# Patient Record
Sex: Female | Born: 1955 | Race: Black or African American | Hispanic: No | Marital: Married | Smoking: Never smoker
Health system: Southern US, Community
[De-identification: ages and names within clinical notes are randomized; demographics above are authoritative.]

---

## 2017-11-13 ENCOUNTER — Other Ambulatory Visit: Payer: Self-pay

## 2017-11-13 ENCOUNTER — Encounter (HOSPITAL_COMMUNITY): Payer: Self-pay | Admitting: Emergency Medicine

## 2017-11-13 ENCOUNTER — Emergency Department (HOSPITAL_COMMUNITY)
Admission: EM | Admit: 2017-11-13 | Discharge: 2017-11-13 | Disposition: A | Discharge status: Home / Self Care | Payer: BC Managed Care – PPO | Attending: Emergency Medicine | Admitting: Emergency Medicine

## 2017-11-13 DIAGNOSIS — R739 Hyperglycemia, unspecified: Secondary | ICD-10-CM | POA: Diagnosis present

## 2017-11-13 LAB — BASIC METABOLIC PANEL
ANION GAP: 15 (ref 5–15)
BUN: 13 mg/dL (ref 6–20)
CALCIUM: 9.6 mg/dL (ref 8.9–10.3)
CO2: 22 mmol/L (ref 22–32)
CREATININE: 0.78 mg/dL (ref 0.44–1.00)
Chloride: 100 mmol/L — ABNORMAL LOW (ref 101–111)
GLUCOSE: 374 mg/dL — AB (ref 65–99)
Potassium: 4 mmol/L (ref 3.5–5.1)
Sodium: 137 mmol/L (ref 135–145)

## 2017-11-13 LAB — CBG MONITORING, ED
GLUCOSE-CAPILLARY: 379 mg/dL — AB (ref 65–99)
Glucose-Capillary: 241 mg/dL — ABNORMAL HIGH (ref 65–99)

## 2017-11-13 LAB — CBC
HCT: 38.5 % (ref 36.0–46.0)
HEMOGLOBIN: 12.3 g/dL (ref 12.0–15.0)
MCH: 25.5 pg — AB (ref 26.0–34.0)
MCHC: 31.9 g/dL (ref 30.0–36.0)
MCV: 79.7 fL (ref 78.0–100.0)
PLATELETS: 227 10*3/uL (ref 150–400)
RBC: 4.83 MIL/uL (ref 3.87–5.11)
RDW: 14.7 % (ref 11.5–15.5)
WBC: 6.7 10*3/uL (ref 4.0–10.5)

## 2017-11-13 LAB — URINALYSIS, ROUTINE W REFLEX MICROSCOPIC
BILIRUBIN URINE: NEGATIVE
Bacteria, UA: NONE SEEN
HGB URINE DIPSTICK: NEGATIVE
Ketones, ur: 80 mg/dL — AB
Leukocytes, UA: NEGATIVE
NITRITE: NEGATIVE
Protein, ur: NEGATIVE mg/dL
SPECIFIC GRAVITY, URINE: 1.031 — AB (ref 1.005–1.030)
pH: 5 (ref 5.0–8.0)

## 2017-11-13 MED ORDER — METFORMIN HCL 500 MG PO TABS: 500.0000 mg | ORAL_TABLET | Freq: Two times a day (BID) | ORAL | 1 refills | Status: AC

## 2017-11-13 MED ORDER — SODIUM CHLORIDE 0.9 % IV BOLUS (SEPSIS)
2000.0000 mL | Freq: Once | INTRAVENOUS | Status: AC
  Administered 2017-11-13: 2000 mL via INTRAVENOUS

## 2017-11-13 NOTE — ED Triage Notes (Addendum)
Pt was sent by [facility] for hyperglycemia.  Pt denies having hx of diabetes. Pt c/o of dizziness, dry mouth, frequency in urination.

## 2017-11-13 NOTE — Discharge Instructions (Signed)
It is important to drink 1 or 2 L of water each day to help decrease the glucose level.  This will also improve your hydration, and make you feel better.  Began taking the metformin prescription, today.  Stay on a low carbohydrate, and schedule an appointment to see your PCP for a checkup in 1 or 2 weeks.  We are giving you some additional information on treatment of diabetes, with diet, exercise, and self care.

## 2017-11-13 NOTE — ED Provider Notes (Signed)
[facility]ERGENCY DEPARTMENT Provider Note   CSN: [id number] Arrival date & time: 11/13/17  1143     History   Chief Complaint Chief Complaint  Patient presents with  . Hyperglycemia    HPI Sheri Small is a 62 y.o. female.  She presents for evaluation of hyperglycemia, first checked at home using her husband's meter, then checked today at a urgent care.  She was therefore directed here for further evaluation, after CBG was found to be greater than 400 and urine contained ketones.  The patient reports her only symptom is dry mouth, and urinary frequency.  She denies nausea, vomiting, abdominal pain, dysuria or hematuria.  She does not have prior episodes of hyperglycemia.  She is a retired Engineer, civil (consulting)nurse.  There is been no fever, chills, focal weakness or paresthesia.  She denies shortness of breath or chest pain.  There are no other known modifying factors.    HPI  History reviewed. No pertinent past medical history.  There are no active problems to display for this patient.   History reviewed. No pertinent surgical history.  OB History   None      Home Medications    Prior to Admission medications   Medication Sig Start Date End Date Taking? Authorizing Provider  metFORMIN (GLUCOPHAGE) 500 MG tablet Take 1 tablet (500 mg total) by mouth 2 (two) times daily with a meal. 11/13/17   Mancel BaleWentz, Eon Zunker, MD    Family History No family history on file.  Social History Social History   Tobacco Use  . Smoking status: Never Smoker  . Smokeless tobacco: Never Used  Substance Use Topics  . Alcohol use: No    Frequency: Never  . Drug use: No     Allergies   Patient has no allergy information on record.   Review of Systems Review of Systems  All other systems reviewed and are negative.    Physical Exam Updated Vital Signs BP (!) 122/58 (BP Location: Left Arm)   Pulse 60   Temp 97.9 F (36.6 C) (Oral)   Resp 16   Ht 5\' 5"  (1.651 m)   Wt 87.1 kg (192 lb)    SpO2 100%   BMI 31.95 kg/m   Physical Exam  Constitutional: She is oriented to person, place, and time. She appears well-developed and well-nourished. No distress.  HENT:  Head: Normocephalic and atraumatic.  Mouth/Throat: Oropharynx is clear and moist.  Eyes: Conjunctivae and EOM are normal. Pupils are equal, round, and reactive to light.  Neck: Normal range of motion and phonation normal. Neck supple.  Cardiovascular: Normal rate and regular rhythm.  Pulmonary/Chest: Effort normal and breath sounds normal. No respiratory distress. She exhibits no tenderness.  Abdominal: Soft. She exhibits no distension. There is no tenderness. There is no guarding.  Musculoskeletal: Normal range of motion.  Neurological: She is alert and oriented to person, place, and time. She exhibits normal muscle tone.  Skin: Skin is warm and dry.  Psychiatric: She has a normal mood and affect. Her behavior is normal. Judgment and thought content normal.  Nursing note and vitals reviewed.    ED Treatments / Results  Labs (all labs ordered are listed, but only abnormal results are displayed) Labs Reviewed  BASIC METABOLIC PANEL - Abnormal; Notable for the following components:      Result Value   Chloride 100 (*)    Glucose, Bld 374 (*)    All other components within normal limits  CBC - Abnormal; Notable for the  following components:   MCH 25.5 (*)    All other components within normal limits  URINALYSIS, ROUTINE W REFLEX MICROSCOPIC - Abnormal; Notable for the following components:   Specific Gravity, Urine 1.031 (*)    Glucose, UA >=500 (*)    Ketones, ur 80 (*)    Squamous Epithelial / LPF 0-5 (*)    All other components within normal limits  CBG MONITORING, ED - Abnormal; Notable for the following components:   Glucose-Capillary 379 (*)    All other components within normal limits  CBG MONITORING, ED - Abnormal; Notable for the following components:   Glucose-Capillary 241 (*)    All other  components within normal limits    EKG  EKG Interpretation None       Radiology No results found.  Procedures Procedures (including critical care time)  Medications Ordered in ED Medications  sodium chloride 0.9 % bolus 2,000 mL (0 mLs Intravenous Stopped 11/13/17 1435)     Initial Impression / Assessment and Plan / ED Course  I have reviewed the triage vital signs and the nursing notes.  Pertinent labs & imaging results that were available during my care of the patient were reviewed by me and considered in my medical decision making (see chart for details).  Clinical Course as of Nov 15 1145  Tue Nov 13, 2017  1447 Laboratory evaluation for hyperglycemia, indicates hyperglycemia with normal anion gap.  Chloride slightly low at 100.  CBC is normal, both hemoglobin and white count.  Urinalysis with elevated glucose, elevated specific gravity, and elevated ketones.   [EW]    Clinical Course User Index [EW] Mancel Bale, MD     No data found.  2:48 PM Reevaluation with update and discussion. After initial assessment and treatment, an updated evaluation reveals patient is more comfortable at this time.  No further complaints.  Findings discussed with patient and husband.  She requests that a prescription be forwarded to her pharmacy in [place]. Mancel Bale   Medical decision making-patient presenting for evaluation of hyperglycemia associated with polyuria and polydipsia.  Treatment with IV fluids improved CBG to 241.  Metabolic screening negative for elevated anion gap.  Urinalysis consistent with dehydration but no infection.  Patient hemodynamically stable in the emergency department.  No indication for hospitalization.  Plan outpatient treatment with initiation of oral antihypoglycemic agent.  Instructions given for weight loss, hydrate management, and follow-up with PCP.  Nursing Notes Reviewed/ Care Coordinated Applicable Imaging Reviewed Interpretation of  Laboratory Data incorporated into ED treatment  The patient appears reasonably screened and/or stabilized for discharge and I doubt any other medical condition or other [facility] requiring further screening, evaluation, or treatment in the ED at this time prior to discharge.  Plan: Home Medications-continue usual over-the-counter medicines and prescriptions; Home Treatments-rest, fluids low carbohydrate diet; return here if the recommended treatment, does not improve the symptoms; Recommended follow up-PCP follow-up 1-2 weeks and as needed.    Final Clinical Impressions(s) / ED Diagnoses   Final diagnoses:  Hyperglycemia   Symptomatic hyperglycemia, with normal anion gap.  Mild dehydration evidenced by elevated urine specific gravity.  Patient is nontoxic and feels better after treatment with IV fluids.  There is no indication of hospitalization at this time.  Doubt DKA, systemic infection, metabolic instability or impending vascular collapse.  Nursing Notes Reviewed/ Care Coordinated Applicable Imaging Reviewed Interpretation of Laboratory Data incorporated into ED treatment  The patient appears reasonably screened and/or stabilized for discharge and I doubt  any other medical condition or other [facility] requiring further screening, evaluation, or treatment in the ED at this time prior to discharge.  Plan: Home Medications-start metformin today, OTC, as needed; Home Treatments-rest, increase oral fluid intake especially water; return here if the recommended treatment, does not improve the symptoms; Recommended follow up-PCP follow-up 1-2 weeks.    ED Discharge Orders        Ordered    metFORMIN (GLUCOPHAGE) 500 MG tablet  2 times daily with meals     11/13/17 1449       Mancel Bale, MD 11/15/17 1149

## 2019-12-12 DIAGNOSIS — Z713 Dietary counseling and surveillance: Secondary | ICD-10-CM | POA: Diagnosis not present

## 2019-12-12 DIAGNOSIS — Z299 Encounter for prophylactic measures, unspecified: Secondary | ICD-10-CM | POA: Diagnosis not present

## 2019-12-12 DIAGNOSIS — E1165 Type 2 diabetes mellitus with hyperglycemia: Secondary | ICD-10-CM | POA: Diagnosis not present

## 2020-02-20 DIAGNOSIS — Z79899 Other long term (current) drug therapy: Secondary | ICD-10-CM | POA: Diagnosis not present

## 2020-02-20 DIAGNOSIS — Z0000 Encounter for general adult medical examination without abnormal findings: Secondary | ICD-10-CM | POA: Diagnosis not present

## 2020-02-20 DIAGNOSIS — E1165 Type 2 diabetes mellitus with hyperglycemia: Secondary | ICD-10-CM | POA: Diagnosis not present

## 2020-02-20 DIAGNOSIS — Z7189 Other specified counseling: Secondary | ICD-10-CM | POA: Diagnosis not present

## 2020-02-20 DIAGNOSIS — E7800 Pure hypercholesterolemia, unspecified: Secondary | ICD-10-CM | POA: Diagnosis not present

## 2020-02-20 DIAGNOSIS — Z1211 Encounter for screening for malignant neoplasm of colon: Secondary | ICD-10-CM | POA: Diagnosis not present

## 2020-02-20 DIAGNOSIS — Z299 Encounter for prophylactic measures, unspecified: Secondary | ICD-10-CM | POA: Diagnosis not present

## 2020-02-20 DIAGNOSIS — R5383 Other fatigue: Secondary | ICD-10-CM | POA: Diagnosis not present

## 2020-03-02 DIAGNOSIS — E1140 Type 2 diabetes mellitus with diabetic neuropathy, unspecified: Secondary | ICD-10-CM | POA: Diagnosis not present

## 2020-03-02 DIAGNOSIS — E1159 Type 2 diabetes mellitus with other circulatory complications: Secondary | ICD-10-CM | POA: Diagnosis not present

## 2020-03-08 DIAGNOSIS — R9431 Abnormal electrocardiogram [ECG] [EKG]: Secondary | ICD-10-CM | POA: Diagnosis not present

## 2020-03-19 DIAGNOSIS — Z299 Encounter for prophylactic measures, unspecified: Secondary | ICD-10-CM | POA: Diagnosis not present

## 2020-03-19 DIAGNOSIS — Z713 Dietary counseling and surveillance: Secondary | ICD-10-CM | POA: Diagnosis not present

## 2020-03-19 DIAGNOSIS — E7800 Pure hypercholesterolemia, unspecified: Secondary | ICD-10-CM | POA: Diagnosis not present

## 2020-03-19 DIAGNOSIS — E1165 Type 2 diabetes mellitus with hyperglycemia: Secondary | ICD-10-CM | POA: Diagnosis not present

## 2020-06-14 DIAGNOSIS — Z1231 Encounter for screening mammogram for malignant neoplasm of breast: Secondary | ICD-10-CM | POA: Diagnosis not present

## 2020-06-28 DIAGNOSIS — D509 Iron deficiency anemia, unspecified: Secondary | ICD-10-CM | POA: Diagnosis not present

## 2020-06-28 DIAGNOSIS — Z23 Encounter for immunization: Secondary | ICD-10-CM | POA: Diagnosis not present

## 2020-08-04 DIAGNOSIS — Z23 Encounter for immunization: Secondary | ICD-10-CM | POA: Diagnosis not present

## 2020-10-05 DIAGNOSIS — E7800 Pure hypercholesterolemia, unspecified: Secondary | ICD-10-CM | POA: Diagnosis not present

## 2020-10-05 DIAGNOSIS — Z299 Encounter for prophylactic measures, unspecified: Secondary | ICD-10-CM | POA: Diagnosis not present

## 2020-10-05 DIAGNOSIS — Z713 Dietary counseling and surveillance: Secondary | ICD-10-CM | POA: Diagnosis not present

## 2020-10-05 DIAGNOSIS — E1165 Type 2 diabetes mellitus with hyperglycemia: Secondary | ICD-10-CM | POA: Diagnosis not present

## 2021-02-25 DIAGNOSIS — Z7189 Other specified counseling: Secondary | ICD-10-CM | POA: Diagnosis not present

## 2021-02-25 DIAGNOSIS — E7800 Pure hypercholesterolemia, unspecified: Secondary | ICD-10-CM | POA: Diagnosis not present

## 2021-02-25 DIAGNOSIS — Z299 Encounter for prophylactic measures, unspecified: Secondary | ICD-10-CM | POA: Diagnosis not present

## 2021-02-25 DIAGNOSIS — Z87891 Personal history of nicotine dependence: Secondary | ICD-10-CM | POA: Diagnosis not present

## 2021-02-25 DIAGNOSIS — Z79899 Other long term (current) drug therapy: Secondary | ICD-10-CM | POA: Diagnosis not present

## 2021-02-25 DIAGNOSIS — R5383 Other fatigue: Secondary | ICD-10-CM | POA: Diagnosis not present

## 2021-02-25 DIAGNOSIS — Z0000 Encounter for general adult medical examination without abnormal findings: Secondary | ICD-10-CM | POA: Diagnosis not present

## 2021-02-25 DIAGNOSIS — E1165 Type 2 diabetes mellitus with hyperglycemia: Secondary | ICD-10-CM | POA: Diagnosis not present

## 2021-04-27 ENCOUNTER — Other Ambulatory Visit: Payer: Self-pay | Admitting: Internal Medicine

## 2021-04-27 DIAGNOSIS — Z139 Encounter for screening, unspecified: Secondary | ICD-10-CM

## 2021-04-29 ENCOUNTER — Ambulatory Visit
Admission: RE | Admit: 2021-04-29 | Discharge: 2021-04-29 | Disposition: A | Discharge status: Home / Self Care | Payer: Medicare Other | Source: Ambulatory Visit | Attending: Internal Medicine | Admitting: Internal Medicine

## 2021-04-29 ENCOUNTER — Other Ambulatory Visit: Payer: Self-pay

## 2021-04-29 DIAGNOSIS — Z1231 Encounter for screening mammogram for malignant neoplasm of breast: Secondary | ICD-10-CM | POA: Diagnosis not present

## 2021-04-29 DIAGNOSIS — Z139 Encounter for screening, unspecified: Secondary | ICD-10-CM

## 2021-06-09 DIAGNOSIS — Z789 Other specified health status: Secondary | ICD-10-CM | POA: Diagnosis not present

## 2021-06-09 DIAGNOSIS — E7800 Pure hypercholesterolemia, unspecified: Secondary | ICD-10-CM | POA: Diagnosis not present

## 2021-06-09 DIAGNOSIS — E1165 Type 2 diabetes mellitus with hyperglycemia: Secondary | ICD-10-CM | POA: Diagnosis not present

## 2021-06-09 DIAGNOSIS — Z23 Encounter for immunization: Secondary | ICD-10-CM | POA: Diagnosis not present

## 2021-06-09 DIAGNOSIS — Z299 Encounter for prophylactic measures, unspecified: Secondary | ICD-10-CM | POA: Diagnosis not present

## 2021-09-14 DIAGNOSIS — Z789 Other specified health status: Secondary | ICD-10-CM | POA: Diagnosis not present

## 2021-09-14 DIAGNOSIS — Z713 Dietary counseling and surveillance: Secondary | ICD-10-CM | POA: Diagnosis not present

## 2021-09-14 DIAGNOSIS — Z299 Encounter for prophylactic measures, unspecified: Secondary | ICD-10-CM | POA: Diagnosis not present

## 2021-09-14 DIAGNOSIS — E1165 Type 2 diabetes mellitus with hyperglycemia: Secondary | ICD-10-CM | POA: Diagnosis not present

## 2021-12-16 DIAGNOSIS — Z713 Dietary counseling and surveillance: Secondary | ICD-10-CM | POA: Diagnosis not present

## 2021-12-16 DIAGNOSIS — Z299 Encounter for prophylactic measures, unspecified: Secondary | ICD-10-CM | POA: Diagnosis not present

## 2021-12-16 DIAGNOSIS — E1165 Type 2 diabetes mellitus with hyperglycemia: Secondary | ICD-10-CM | POA: Diagnosis not present

## 2022-02-21 IMAGING — MG MM DIGITAL SCREENING BILAT W/ TOMO AND CAD
8 series · 9 of 24 positions shown · non-contrast
Comparison: Previous exam(s).

CLINICAL DATA: Screening.

EXAM:
DIGITAL SCREENING BILATERAL MAMMOGRAM WITH TOMOSYNTHESIS AND CAD
TECHNIQUE: Bilateral screening digital craniocaudal and mediolateral oblique
mammograms were obtained. Bilateral screening digital breast
tomosynthesis was performed. The images were evaluated with
computer-aided detection.

[R MLO synth-2D]
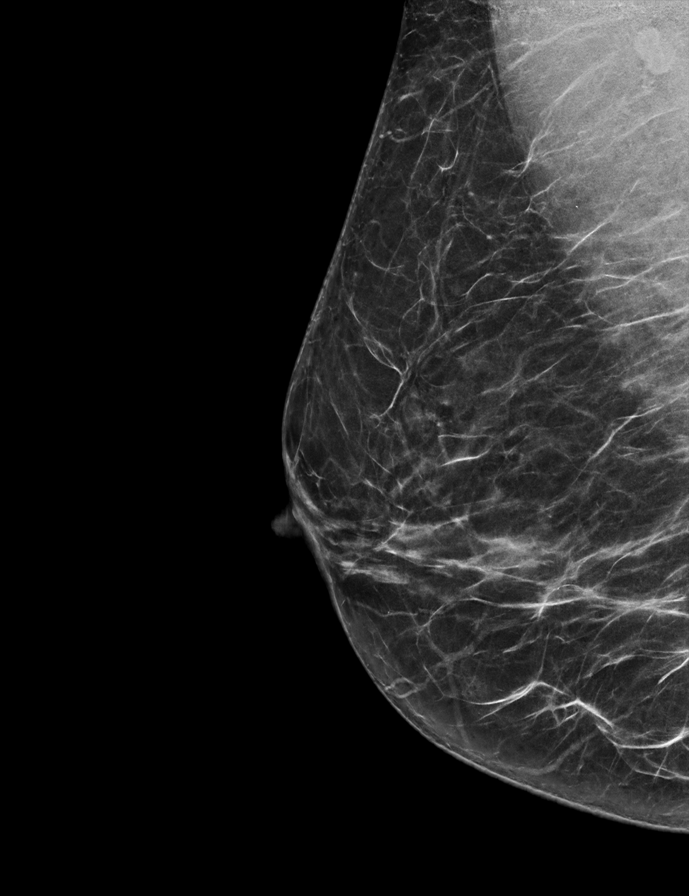

[R CC synth-2D]
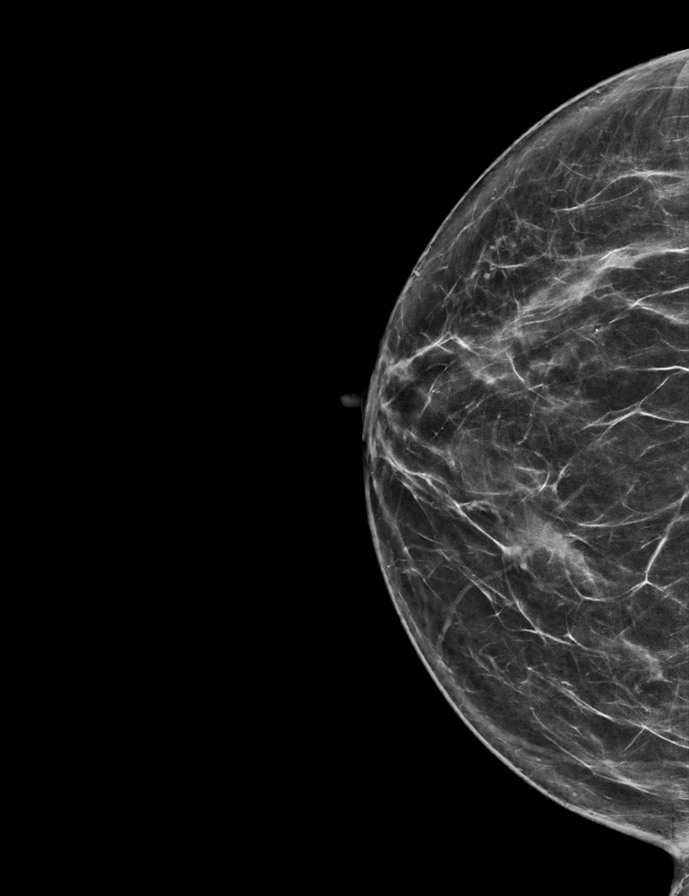

[L MLO synth-2D]
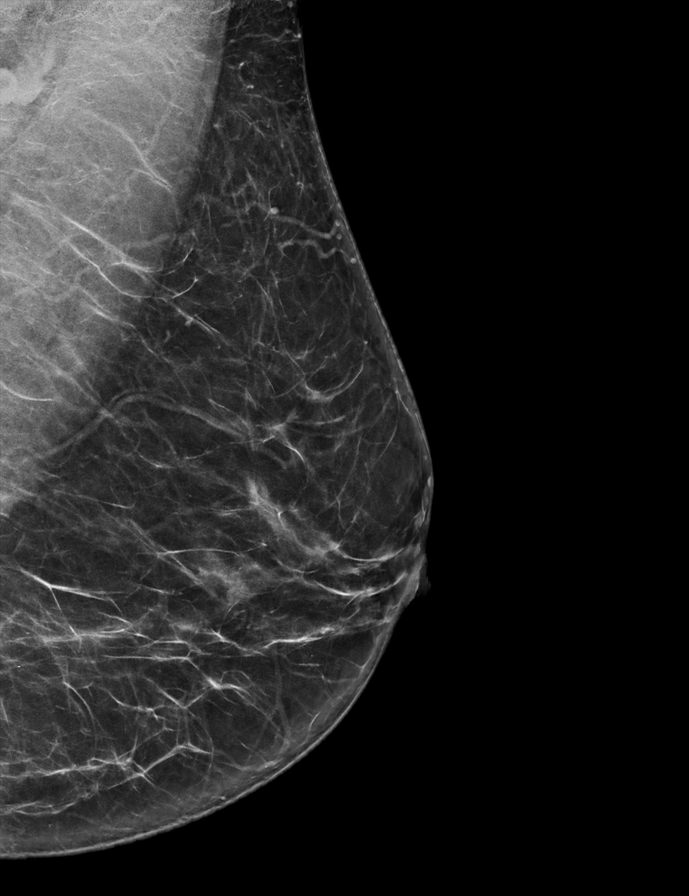

[L CC synth-2D]
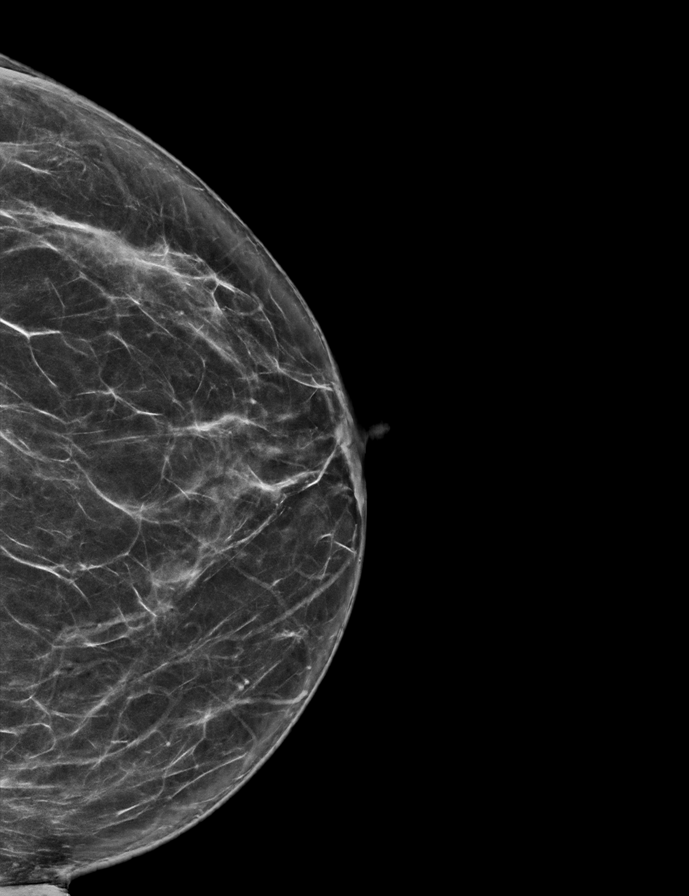

[L CC tomo · 2 of 69 frames shown]
[frame 23/69]
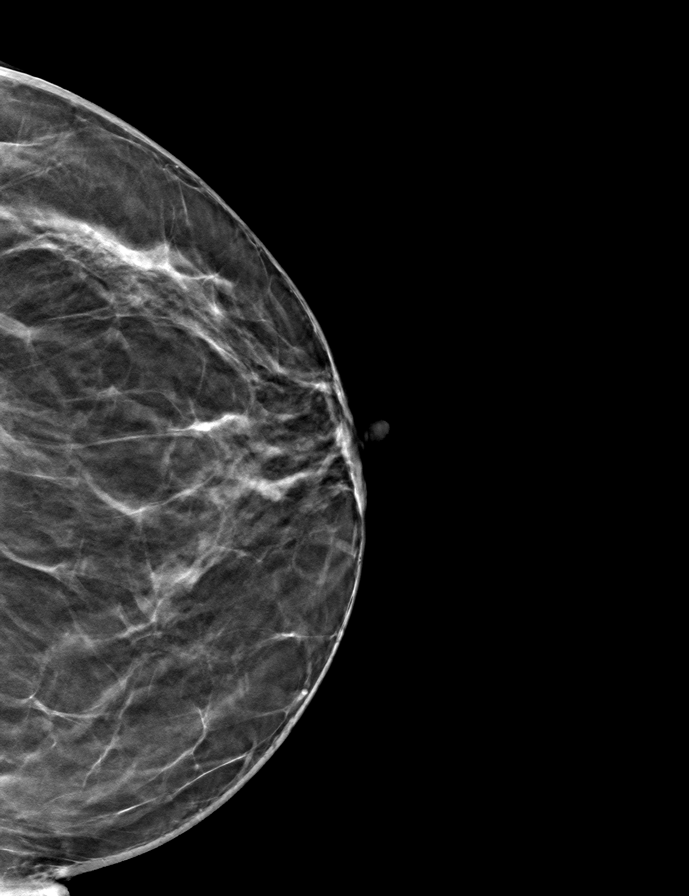
[frame 35/69]
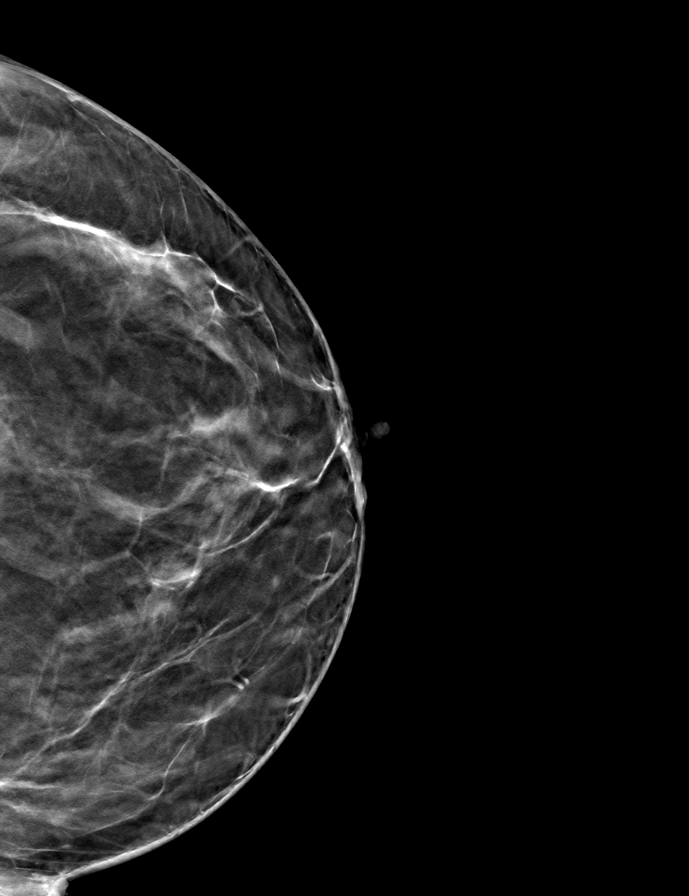

[R MLO tomo · tomo slice 33/65.0]
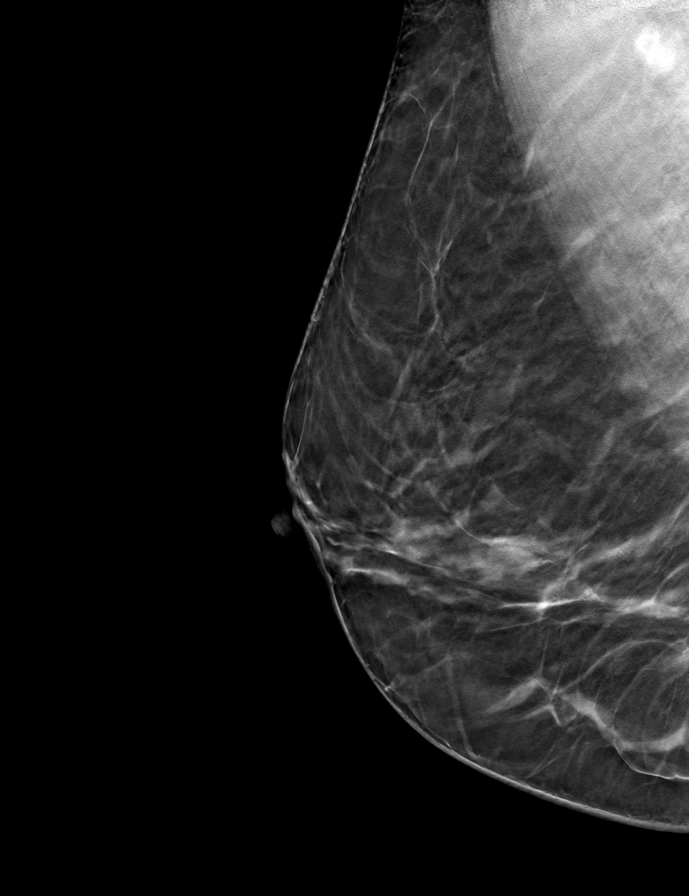

[L MLO tomo · tomo slice 37/72.0]
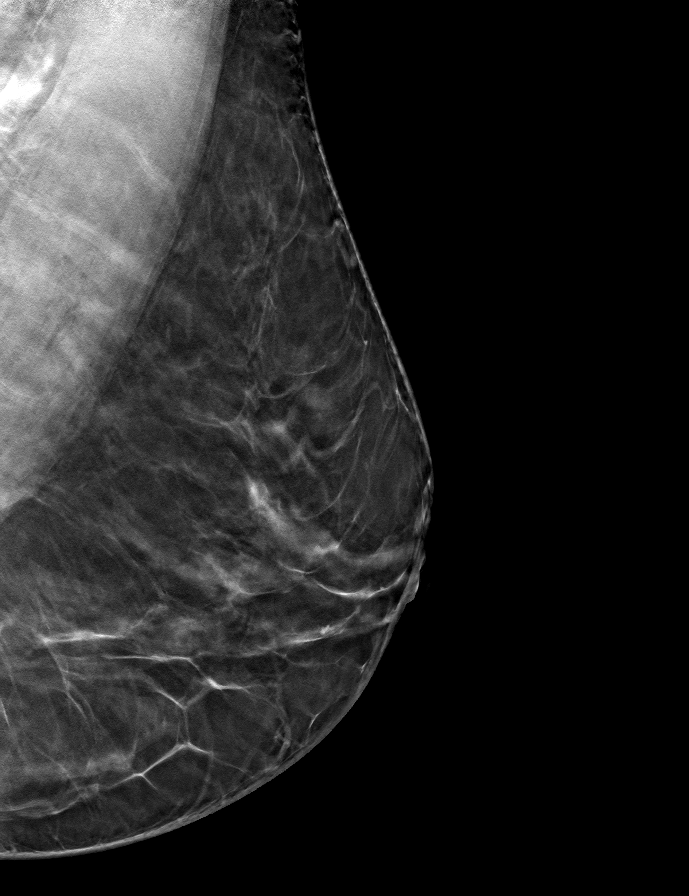

[R CC tomo · tomo slice 35/68.0]
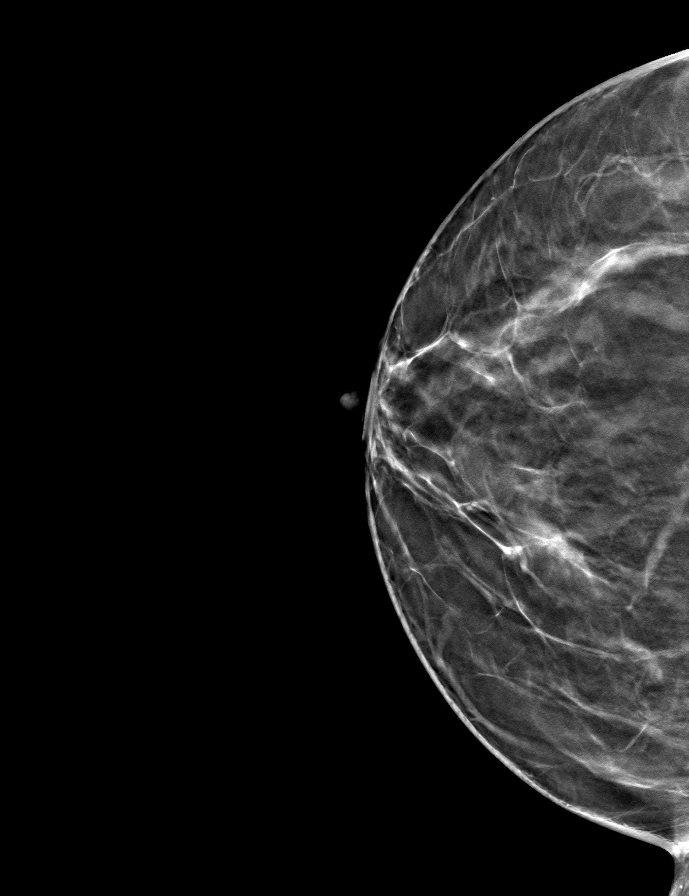

[9 of 24 positions shown; findings below may reference images not displayed]

ACR Breast Density Category b: There are scattered areas of
fibroglandular density.
FINDINGS: There are no findings suspicious for malignancy.
IMPRESSION: No mammographic evidence of malignancy. A result letter of this
screening mammogram will be mailed directly to the patient.

RECOMMENDATION:
Screening mammogram in one year. (Code:51-O-LD2)

BI-RADS CATEGORY  1: Negative.

## 2022-03-03 DIAGNOSIS — Z299 Encounter for prophylactic measures, unspecified: Secondary | ICD-10-CM | POA: Diagnosis not present

## 2022-03-03 DIAGNOSIS — R5383 Other fatigue: Secondary | ICD-10-CM | POA: Diagnosis not present

## 2022-03-03 DIAGNOSIS — Z789 Other specified health status: Secondary | ICD-10-CM | POA: Diagnosis not present

## 2022-03-03 DIAGNOSIS — Z79899 Other long term (current) drug therapy: Secondary | ICD-10-CM | POA: Diagnosis not present

## 2022-03-03 DIAGNOSIS — E7800 Pure hypercholesterolemia, unspecified: Secondary | ICD-10-CM | POA: Diagnosis not present

## 2022-03-03 DIAGNOSIS — Z7189 Other specified counseling: Secondary | ICD-10-CM | POA: Diagnosis not present

## 2022-03-03 DIAGNOSIS — Z0000 Encounter for general adult medical examination without abnormal findings: Secondary | ICD-10-CM | POA: Diagnosis not present

## 2022-03-24 DIAGNOSIS — Z713 Dietary counseling and surveillance: Secondary | ICD-10-CM | POA: Diagnosis not present

## 2022-03-24 DIAGNOSIS — D509 Iron deficiency anemia, unspecified: Secondary | ICD-10-CM | POA: Diagnosis not present

## 2022-03-24 DIAGNOSIS — E1165 Type 2 diabetes mellitus with hyperglycemia: Secondary | ICD-10-CM | POA: Diagnosis not present

## 2022-03-24 DIAGNOSIS — Z299 Encounter for prophylactic measures, unspecified: Secondary | ICD-10-CM | POA: Diagnosis not present

## 2022-05-10 ENCOUNTER — Other Ambulatory Visit: Payer: Self-pay | Admitting: Internal Medicine

## 2022-05-10 DIAGNOSIS — Z1231 Encounter for screening mammogram for malignant neoplasm of breast: Secondary | ICD-10-CM

## 2022-06-07 ENCOUNTER — Ambulatory Visit
Admission: RE | Admit: 2022-06-07 | Discharge: 2022-06-07 | Disposition: A | Discharge status: Home / Self Care | Payer: BC Managed Care – PPO | Source: Ambulatory Visit | Attending: Internal Medicine | Admitting: Internal Medicine

## 2022-06-07 DIAGNOSIS — Z1231 Encounter for screening mammogram for malignant neoplasm of breast: Secondary | ICD-10-CM

## 2022-06-26 DIAGNOSIS — Z713 Dietary counseling and surveillance: Secondary | ICD-10-CM | POA: Diagnosis not present

## 2022-06-26 DIAGNOSIS — Z23 Encounter for immunization: Secondary | ICD-10-CM | POA: Diagnosis not present

## 2022-06-26 DIAGNOSIS — Z299 Encounter for prophylactic measures, unspecified: Secondary | ICD-10-CM | POA: Diagnosis not present

## 2022-06-26 DIAGNOSIS — E1165 Type 2 diabetes mellitus with hyperglycemia: Secondary | ICD-10-CM | POA: Diagnosis not present

## 2022-07-04 DIAGNOSIS — Z299 Encounter for prophylactic measures, unspecified: Secondary | ICD-10-CM | POA: Diagnosis not present

## 2022-07-04 DIAGNOSIS — Z713 Dietary counseling and surveillance: Secondary | ICD-10-CM | POA: Diagnosis not present

## 2022-07-04 DIAGNOSIS — Z0000 Encounter for general adult medical examination without abnormal findings: Secondary | ICD-10-CM | POA: Diagnosis not present

## 2022-09-13 DIAGNOSIS — H40013 Open angle with borderline findings, low risk, bilateral: Secondary | ICD-10-CM | POA: Diagnosis not present

## 2022-09-13 DIAGNOSIS — H524 Presbyopia: Secondary | ICD-10-CM | POA: Diagnosis not present

## 2022-09-13 DIAGNOSIS — E119 Type 2 diabetes mellitus without complications: Secondary | ICD-10-CM | POA: Diagnosis not present

## 2022-09-13 DIAGNOSIS — H2513 Age-related nuclear cataract, bilateral: Secondary | ICD-10-CM | POA: Diagnosis not present

## 2022-09-13 DIAGNOSIS — H35033 Hypertensive retinopathy, bilateral: Secondary | ICD-10-CM | POA: Diagnosis not present

## 2022-09-18 DIAGNOSIS — M549 Dorsalgia, unspecified: Secondary | ICD-10-CM | POA: Diagnosis not present

## 2022-09-18 DIAGNOSIS — E7800 Pure hypercholesterolemia, unspecified: Secondary | ICD-10-CM | POA: Diagnosis not present

## 2022-09-18 DIAGNOSIS — E1165 Type 2 diabetes mellitus with hyperglycemia: Secondary | ICD-10-CM | POA: Diagnosis not present

## 2022-09-18 DIAGNOSIS — Z299 Encounter for prophylactic measures, unspecified: Secondary | ICD-10-CM | POA: Diagnosis not present

## 2022-09-27 DIAGNOSIS — E1165 Type 2 diabetes mellitus with hyperglycemia: Secondary | ICD-10-CM | POA: Diagnosis not present

## 2022-09-27 DIAGNOSIS — Z299 Encounter for prophylactic measures, unspecified: Secondary | ICD-10-CM | POA: Diagnosis not present

## 2022-11-16 DIAGNOSIS — I739 Peripheral vascular disease, unspecified: Secondary | ICD-10-CM | POA: Diagnosis not present

## 2022-11-16 DIAGNOSIS — M79606 Pain in leg, unspecified: Secondary | ICD-10-CM | POA: Diagnosis not present

## 2022-11-16 DIAGNOSIS — Z299 Encounter for prophylactic measures, unspecified: Secondary | ICD-10-CM | POA: Diagnosis not present

## 2022-12-11 DIAGNOSIS — I70211 Atherosclerosis of native arteries of extremities with intermittent claudication, right leg: Secondary | ICD-10-CM | POA: Diagnosis not present

## 2022-12-29 DIAGNOSIS — E7800 Pure hypercholesterolemia, unspecified: Secondary | ICD-10-CM | POA: Diagnosis not present

## 2022-12-29 DIAGNOSIS — D509 Iron deficiency anemia, unspecified: Secondary | ICD-10-CM | POA: Diagnosis not present

## 2022-12-29 DIAGNOSIS — E1165 Type 2 diabetes mellitus with hyperglycemia: Secondary | ICD-10-CM | POA: Diagnosis not present

## 2022-12-29 DIAGNOSIS — R059 Cough, unspecified: Secondary | ICD-10-CM | POA: Diagnosis not present

## 2022-12-29 DIAGNOSIS — Z299 Encounter for prophylactic measures, unspecified: Secondary | ICD-10-CM | POA: Diagnosis not present

## 2023-03-07 DIAGNOSIS — Z299 Encounter for prophylactic measures, unspecified: Secondary | ICD-10-CM | POA: Diagnosis not present

## 2023-03-07 DIAGNOSIS — R5383 Other fatigue: Secondary | ICD-10-CM | POA: Diagnosis not present

## 2023-03-07 DIAGNOSIS — E7800 Pure hypercholesterolemia, unspecified: Secondary | ICD-10-CM | POA: Diagnosis not present

## 2023-03-07 DIAGNOSIS — I739 Peripheral vascular disease, unspecified: Secondary | ICD-10-CM | POA: Diagnosis not present

## 2023-03-07 DIAGNOSIS — Z7189 Other specified counseling: Secondary | ICD-10-CM | POA: Diagnosis not present

## 2023-03-07 DIAGNOSIS — Z79899 Other long term (current) drug therapy: Secondary | ICD-10-CM | POA: Diagnosis not present

## 2023-03-07 DIAGNOSIS — Z0000 Encounter for general adult medical examination without abnormal findings: Secondary | ICD-10-CM | POA: Diagnosis not present

## 2023-03-14 DIAGNOSIS — H40013 Open angle with borderline findings, low risk, bilateral: Secondary | ICD-10-CM | POA: Diagnosis not present

## 2023-04-06 DIAGNOSIS — E1165 Type 2 diabetes mellitus with hyperglycemia: Secondary | ICD-10-CM | POA: Diagnosis not present

## 2023-04-06 DIAGNOSIS — E7800 Pure hypercholesterolemia, unspecified: Secondary | ICD-10-CM | POA: Diagnosis not present

## 2023-04-06 DIAGNOSIS — M549 Dorsalgia, unspecified: Secondary | ICD-10-CM | POA: Diagnosis not present

## 2023-04-06 DIAGNOSIS — Z299 Encounter for prophylactic measures, unspecified: Secondary | ICD-10-CM | POA: Diagnosis not present

## 2023-04-06 DIAGNOSIS — I739 Peripheral vascular disease, unspecified: Secondary | ICD-10-CM | POA: Diagnosis not present

## 2023-05-31 ENCOUNTER — Other Ambulatory Visit: Payer: Self-pay | Admitting: Internal Medicine

## 2023-05-31 DIAGNOSIS — Z1231 Encounter for screening mammogram for malignant neoplasm of breast: Secondary | ICD-10-CM

## 2023-06-18 ENCOUNTER — Ambulatory Visit
Admission: RE | Admit: 2023-06-18 | Discharge: 2023-06-18 | Disposition: A | Discharge status: Home / Self Care | Payer: Medicare Other | Source: Ambulatory Visit | Attending: Internal Medicine | Admitting: Internal Medicine

## 2023-06-18 DIAGNOSIS — Z1231 Encounter for screening mammogram for malignant neoplasm of breast: Secondary | ICD-10-CM | POA: Diagnosis not present

## 2023-07-06 DIAGNOSIS — E1165 Type 2 diabetes mellitus with hyperglycemia: Secondary | ICD-10-CM | POA: Diagnosis not present

## 2023-07-06 DIAGNOSIS — Z0000 Encounter for general adult medical examination without abnormal findings: Secondary | ICD-10-CM | POA: Diagnosis not present

## 2023-07-06 DIAGNOSIS — I739 Peripheral vascular disease, unspecified: Secondary | ICD-10-CM | POA: Diagnosis not present

## 2023-07-06 DIAGNOSIS — Z299 Encounter for prophylactic measures, unspecified: Secondary | ICD-10-CM | POA: Diagnosis not present

## 2023-07-11 DIAGNOSIS — E1165 Type 2 diabetes mellitus with hyperglycemia: Secondary | ICD-10-CM | POA: Diagnosis not present

## 2023-07-11 DIAGNOSIS — Z299 Encounter for prophylactic measures, unspecified: Secondary | ICD-10-CM | POA: Diagnosis not present

## 2023-07-11 DIAGNOSIS — I739 Peripheral vascular disease, unspecified: Secondary | ICD-10-CM | POA: Diagnosis not present

## 2024-03-13 DIAGNOSIS — H40013 Open angle with borderline findings, low risk, bilateral: Secondary | ICD-10-CM | POA: Diagnosis not present

## 2024-03-26 DIAGNOSIS — E1165 Type 2 diabetes mellitus with hyperglycemia: Secondary | ICD-10-CM | POA: Diagnosis not present

## 2024-03-26 DIAGNOSIS — E7800 Pure hypercholesterolemia, unspecified: Secondary | ICD-10-CM | POA: Diagnosis not present

## 2024-03-26 DIAGNOSIS — Z299 Encounter for prophylactic measures, unspecified: Secondary | ICD-10-CM | POA: Diagnosis not present

## 2024-03-26 DIAGNOSIS — E119 Type 2 diabetes mellitus without complications: Secondary | ICD-10-CM | POA: Diagnosis not present

## 2024-05-29 ENCOUNTER — Other Ambulatory Visit: Payer: Self-pay | Admitting: Internal Medicine

## 2024-05-29 DIAGNOSIS — Z1231 Encounter for screening mammogram for malignant neoplasm of breast: Secondary | ICD-10-CM

## 2024-06-30 DIAGNOSIS — Z23 Encounter for immunization: Secondary | ICD-10-CM | POA: Diagnosis not present

## 2024-06-30 DIAGNOSIS — Z299 Encounter for prophylactic measures, unspecified: Secondary | ICD-10-CM | POA: Diagnosis not present

## 2024-06-30 DIAGNOSIS — R52 Pain, unspecified: Secondary | ICD-10-CM | POA: Diagnosis not present

## 2024-06-30 DIAGNOSIS — M549 Dorsalgia, unspecified: Secondary | ICD-10-CM | POA: Diagnosis not present

## 2024-06-30 DIAGNOSIS — E119 Type 2 diabetes mellitus without complications: Secondary | ICD-10-CM | POA: Diagnosis not present

## 2024-07-01 ENCOUNTER — Ambulatory Visit

## 2024-07-16 ENCOUNTER — Ambulatory Visit
Admission: RE | Admit: 2024-07-16 | Discharge: 2024-07-16 | Disposition: A | Source: Ambulatory Visit | Attending: Internal Medicine | Admitting: Internal Medicine

## 2024-07-16 DIAGNOSIS — Z1231 Encounter for screening mammogram for malignant neoplasm of breast: Secondary | ICD-10-CM

## 2024-07-21 DIAGNOSIS — Z1331 Encounter for screening for depression: Secondary | ICD-10-CM | POA: Diagnosis not present

## 2024-07-21 DIAGNOSIS — Z0000 Encounter for general adult medical examination without abnormal findings: Secondary | ICD-10-CM | POA: Diagnosis not present

## 2024-07-21 DIAGNOSIS — Q691 Accessory thumb(s): Secondary | ICD-10-CM | POA: Diagnosis not present

## 2024-07-21 DIAGNOSIS — Z299 Encounter for prophylactic measures, unspecified: Secondary | ICD-10-CM | POA: Diagnosis not present

## 2024-07-21 DIAGNOSIS — R5383 Other fatigue: Secondary | ICD-10-CM | POA: Diagnosis not present

## 2024-07-21 DIAGNOSIS — R52 Pain, unspecified: Secondary | ICD-10-CM | POA: Diagnosis not present

## 2024-07-21 DIAGNOSIS — E1169 Type 2 diabetes mellitus with other specified complication: Secondary | ICD-10-CM | POA: Diagnosis not present

## 2024-07-21 DIAGNOSIS — D509 Iron deficiency anemia, unspecified: Secondary | ICD-10-CM | POA: Diagnosis not present

## 2024-07-23 DIAGNOSIS — E7800 Pure hypercholesterolemia, unspecified: Secondary | ICD-10-CM | POA: Diagnosis not present

## 2024-07-23 DIAGNOSIS — E559 Vitamin D deficiency, unspecified: Secondary | ICD-10-CM | POA: Diagnosis not present

## 2024-07-23 DIAGNOSIS — R5383 Other fatigue: Secondary | ICD-10-CM | POA: Diagnosis not present

## 2024-07-23 DIAGNOSIS — Z79899 Other long term (current) drug therapy: Secondary | ICD-10-CM | POA: Diagnosis not present
